# Patient Record
Sex: Male | Born: 1995 | Race: White | Hispanic: No | Marital: Single | Smoking: Current every day smoker
Health system: Southern US, Community
[De-identification: ages and names within clinical notes are randomized; demographics above are authoritative.]

## PROBLEM LIST (undated history)

## (undated) HISTORY — PX: TONSILLECTOMY: SUR1361

---

## 2020-08-06 ENCOUNTER — Other Ambulatory Visit: Payer: Self-pay

## 2020-08-06 ENCOUNTER — Encounter (HOSPITAL_COMMUNITY): Payer: Self-pay

## 2020-08-06 DIAGNOSIS — R079 Chest pain, unspecified: Secondary | ICD-10-CM | POA: Diagnosis present

## 2020-08-06 DIAGNOSIS — N63 Unspecified lump in unspecified breast: Secondary | ICD-10-CM | POA: Insufficient documentation

## 2020-08-06 NOTE — ED Triage Notes (Signed)
Patient arrived stating tonight around 8pm he began having left sided chest pain when taking a deep breath. Declines any other symptoms at this time.

## 2020-08-07 ENCOUNTER — Emergency Department (HOSPITAL_COMMUNITY)
Admission: EM | Admit: 2020-08-07 | Discharge: 2020-08-07 | Disposition: A | Discharge status: Home / Self Care | Payer: PRIVATE HEALTH INSURANCE | Attending: Emergency Medicine | Admitting: Emergency Medicine

## 2020-08-07 ENCOUNTER — Emergency Department (HOSPITAL_COMMUNITY): Payer: PRIVATE HEALTH INSURANCE

## 2020-08-07 DIAGNOSIS — N63 Unspecified lump in unspecified breast: Secondary | ICD-10-CM

## 2020-08-07 LAB — CBC
HCT: 42.5 % (ref 39.0–52.0)
Hemoglobin: 14.9 g/dL (ref 13.0–17.0)
MCH: 31.7 pg (ref 26.0–34.0)
MCHC: 35.1 g/dL (ref 30.0–36.0)
MCV: 90.4 fL (ref 80.0–100.0)
Platelets: 271 10*3/uL (ref 150–400)
RBC: 4.7 MIL/uL (ref 4.22–5.81)
RDW: 11.9 % (ref 11.5–15.5)
WBC: 7.3 10*3/uL (ref 4.0–10.5)
nRBC: 0 % (ref 0.0–0.2)

## 2020-08-07 LAB — BASIC METABOLIC PANEL
Anion gap: 11 (ref 5–15)
BUN: 15 mg/dL (ref 6–20)
CO2: 26 mmol/L (ref 22–32)
Calcium: 9.5 mg/dL (ref 8.9–10.3)
Chloride: 101 mmol/L (ref 98–111)
Creatinine, Ser: 0.92 mg/dL (ref 0.61–1.24)
GFR calc Af Amer: >60 mL/min (ref >60)
GFR calc non Af Amer: >60 mL/min (ref >60)
Glucose, Bld: 94 mg/dL (ref 70–99)
Potassium: 3.7 mmol/L (ref 3.5–5.1)
Sodium: 138 mmol/L (ref 135–145)

## 2020-08-07 LAB — TROPONIN I (HIGH SENSITIVITY): Troponin I (High Sensitivity): 4 ng/L (ref <18)

## 2020-08-07 NOTE — ED Provider Notes (Signed)
[facility] EMERGENCY DEPARTMENT Provider Note  CSN: [id number] Arrival date & time: 08/06/20 2122    History Chief Complaint  Patient presents with  . Chest Pain    HPI  Steven Mckay is a 24 y.o. male with no significant PMH reports he noticed a lump in his L chest after he got his Covid vaccine in April of this year. He reports no change in that lump in the meantime, but he did noticed some mild-moderate aching pain in that area last night with deep breath that prompted him to come to the ED for evaluation. Denies SOB, cough or fever. Pain has improved while waiting for an ED room to be available.    History reviewed. No pertinent past medical history.  History reviewed. No pertinent surgical history.  No family history on file.  Social History   Tobacco Use  . Smoking status: Not on file  Substance Use Topics  . Alcohol use: Not on file  . Drug use: Not on file     Home Medications Prior to Admission medications   Not on File     Allergies    Penicillins   Review of Systems   Review of Systems A comprehensive review of systems was completed and negative except as noted in HPI.    Physical Exam BP 118/60 (BP Location: Right Arm)   Pulse 68   Temp 98.9 F (37.2 C) (Oral)   Resp 16   SpO2 100%   Physical Exam Vitals and nursing note reviewed.  Constitutional:      Appearance: Normal appearance.  HENT:     Head: Normocephalic and atraumatic.     Nose: Nose normal.     Mouth/Throat:     Mouth: Mucous membranes are moist.  Eyes:     Extraocular Movements: Extraocular movements intact.     Conjunctiva/sclera: Conjunctivae normal.  Cardiovascular:     Rate and Rhythm: Normal rate.  Pulmonary:     Effort: Pulmonary effort is normal.     Breath sounds: Normal breath sounds.  Chest:     Comments: Small 1cm mass under the L nipple, not tender, no nipple discharge Abdominal:     General: Abdomen is flat.     Palpations: Abdomen is soft.      Tenderness: There is no abdominal tenderness.  Musculoskeletal:        General: No swelling. Normal range of motion.     Cervical back: Neck supple.  Skin:    General: Skin is warm and dry.  Neurological:     General: No focal deficit present.     Mental Status: He is alert.  Psychiatric:        Mood and Affect: Mood normal.      ED Results / Procedures / Treatments   Labs (all labs ordered are listed, but only abnormal results are displayed) Labs Reviewed  BASIC METABOLIC PANEL  CBC  TROPONIN I (HIGH SENSITIVITY)    EKG EKG Interpretation  Date/Time:  Saturday August 06 2020 23:47:22 EDT Ventricular Rate:  77 PR Interval:    QRS Duration: 78 QT Interval:  377 QTC Calculation: 427 R Axis:   87 Text Interpretation: Sinus rhythm Normal ECG No old tracing to compare Confirmed by Dione Booze ([id number]) on 08/07/2020 12:29:56 AM   Radiology DG Chest 2 View  Result Date: 08/07/2020 CLINICAL DATA:  Chest pain. EXAM: CHEST - 2 VIEW COMPARISON:  None. FINDINGS: The heart size and mediastinal contours are within normal  limits. Both lungs are clear. The visualized skeletal structures are unremarkable. IMPRESSION: No active cardiopulmonary disease. Electronically Signed   By: Aram Candela M.D.   On: 08/07/2020 00:09    Procedures Procedures  Medications Ordered in the ED Medications - No data to display   MDM Rules/Calculators/A&P MDM Patient reassured that his EKG, CXR and labs including troponin are normal. He does not have any signs of cardiopulmonary process. He is mostly concerned about the lump in his L breast. Advised that the evaluation for this would need to be done as an outpatient. He does not have a PCP, so will be referred to [facility] health [facility] wellness clinic. Recommend RTED for any other concerns.  ED Course  I have reviewed the triage vital signs and the nursing notes.  Pertinent labs & imaging results that were available during my care of the  patient were reviewed by me and considered in my medical decision making (see chart for details).     Final Clinical Impression(s) / ED Diagnoses Final diagnoses:  Breast lump    Rx / DC Orders ED Discharge Orders    None       Pollyann Savoy, MD 08/07/20 [phone]

## 2021-03-06 ENCOUNTER — Other Ambulatory Visit: Payer: Self-pay

## 2021-03-06 ENCOUNTER — Ambulatory Visit: Payer: 59 | Attending: Internal Medicine | Admitting: Internal Medicine

## 2021-03-06 ENCOUNTER — Encounter: Payer: Self-pay | Admitting: Internal Medicine

## 2021-03-06 VITALS — BP 123/83 | HR 81 | Resp 16 | Ht 67.5 in | Wt 109.4 lb

## 2021-03-06 DIAGNOSIS — F17210 Nicotine dependence, cigarettes, uncomplicated: Secondary | ICD-10-CM

## 2021-03-06 DIAGNOSIS — N6320 Unspecified lump in the left breast, unspecified quadrant: Secondary | ICD-10-CM

## 2021-03-06 DIAGNOSIS — Z2821 Immunization not carried out because of patient refusal: Secondary | ICD-10-CM

## 2021-03-06 NOTE — Progress Notes (Signed)
Patient ID: Steven Mckay, male    DOB: 07-Oct-1996  MRN: [id number]  CC: New Patient (Initial Visit) and Breast Mass (Left )   Subjective: Steven Mckay is a 25 y.o. male who presents for new pt visit. His concerns today include:   No previous PCP No chronic med issues.  1 yr ago notice swelling under LT nipple Got a little bigger in the beginning and would cause pain if he lays on his chest.  No increase in breast size.  She is adopted so he does not know whether there is any history of breast cancer in the family. No drainage from the nipple,  Past medical, social, family history and surgical history reviewed. Patient Active Problem List   Diagnosis Date Noted  . Tetanus, diphtheria, and acellular pertussis (Tdap) vaccination declined 03/06/2021     No current outpatient medications on file prior to visit.   No current facility-administered medications on file prior to visit.    Allergies  Allergen Reactions  . Penicillins Hives    Social History   Socioeconomic History  . Marital status: Single    Spouse name: Not on file  . Number of children: 0  . Years of education: Not on file  . Highest education level: Bachelor's degree (e.g., BA, AB, BS)  Occupational History  . Not on file  Tobacco Use  . Smoking status: Current Every Day Smoker  . Smokeless tobacco: Never Used  . Tobacco comment: 1-2 cigarettes/day  Vaping Use  . Vaping Use: Never used  Substance and Sexual Activity  . Alcohol use: Yes  . Drug use: Never  . Sexual activity: Not on file  Other Topics Concern  . Not on file  Social History Narrative  . Not on file   Social Determinants of Health   Financial Resource Strain: Not on file  Food Insecurity: Not on file  Transportation Needs: Not on file  Physical Activity: Not on file  Stress: Not on file  Social Connections: Not on file  Intimate Partner Violence: Not on file    Family History  Adopted: Yes  Family history unknown:  Yes    Past Surgical History:  Procedure Laterality Date  . TONSILLECTOMY      ROS: Review of Systems Negative except as stated above  PHYSICAL EXAM: BP 123/83   Pulse 81   Resp 16   Ht 5' 7.5" (1.715 m)   Wt 109 lb 6.4 oz (49.6 kg)   SpO2 97%   BMI 16.88 kg/m   Physical Exam  General appearance - alert, well appearing, young Caucasian male and in no distress Mental status - normal mood, behavior, speech, dress, motor activity, and thought processes Breasts -no gynecomastia.  No discharge from the nipple.  He has a mild feeling of fullness behind the left areola that is only appreciated with pinching the areola.  No abnormalities appreciated on examination of the right breast.  No axillary lymphadenopathy.  CMP Latest Ref Rng & Units 08/06/2020  Glucose 70 - 99 mg/dL 94  BUN 6 - 20 mg/dL 15  Creatinine 6.16 - 0.73 mg/dL 7.10  Sodium 626 - 948 mmol/L 138  Potassium 3.5 - 5.1 mmol/L 3.7  Chloride 98 - 111 mmol/L 101  CO2 22 - 32 mmol/L 26  Calcium 8.9 - 10.3 mg/dL 9.5   Lipid Panel  No results found for: CHOL, TRIG, HDL, CHOLHDL, VLDL, LDLCALC, LDLDIRECT  CBC    Component Value Date/Time   WBC 7.3  08/06/2020 2354   RBC 4.70 08/06/2020 2354   HGB 14.9 08/06/2020 2354   HCT 42.5 08/06/2020 2354   PLT 271 08/06/2020 2354   MCV 90.4 08/06/2020 2354   MCH 31.7 08/06/2020 2354   MCHC 35.1 08/06/2020 2354   RDW 11.9 08/06/2020 2354    ASSESSMENT AND PLAN: 1. Breast mass, left We will get an ultrasound to evaluate this area further.  Further management will be based on results.  Advised patient that he should be called by the breast center to schedule an appointment.  If he does not receive a call from them in 1 to 2 weeks he should call us back so that we can facilitate for him.  I will call him once I get the results of the report. - US BREAST COMPLETE UNI LEFT INC AXILLA; Future  2. Light smoker Advised to quit smoking.  Discussed health risks associated with  smoking.  Patient states he will consider doing so.  3. Tetanus, diphtheria, and acellular pertussis (Tdap) vaccination declined Offered.  Patient declined.     Patient was given the opportunity to ask questions.  Patient verbalized understanding of the plan and was able to repeat key elements of the plan.   Orders Placed This Encounter  Procedures  . US BREAST COMPLETE UNI LEFT INC AXILLA     Requested Prescriptions    No prescriptions requested or ordered in this encounter    Return if symptoms worsen or fail to improve.  Jonah Blue, MD, FACP

## 2021-03-06 NOTE — Patient Instructions (Addendum)
I have referred you to the breast center for an ultrasound on the left breast.  You will be called for this appointment.  If you have not heard from them in 1 to 2 weeks, please call us back and let us know so that we can facilitate getting the appointment.  I will call you once we get the results.

## 2021-04-10 ENCOUNTER — Ambulatory Visit
Admission: RE | Admit: 2021-04-10 | Discharge: 2021-04-10 | Disposition: A | Discharge status: Home / Self Care | Payer: PRIVATE HEALTH INSURANCE | Source: Ambulatory Visit | Attending: Internal Medicine | Admitting: Internal Medicine

## 2021-04-10 ENCOUNTER — Other Ambulatory Visit: Payer: Self-pay

## 2021-04-10 ENCOUNTER — Encounter: Payer: Self-pay | Admitting: Internal Medicine

## 2021-04-10 ENCOUNTER — Other Ambulatory Visit: Payer: Self-pay | Admitting: Internal Medicine

## 2021-04-10 DIAGNOSIS — N6320 Unspecified lump in the left breast, unspecified quadrant: Secondary | ICD-10-CM

## 2021-04-10 DIAGNOSIS — N62 Hypertrophy of breast: Secondary | ICD-10-CM

## 2021-07-02 ENCOUNTER — Encounter: Payer: Self-pay | Admitting: Internal Medicine

## 2021-07-14 ENCOUNTER — Encounter: Payer: Self-pay | Admitting: Internal Medicine

## 2021-07-14 ENCOUNTER — Other Ambulatory Visit: Payer: Self-pay

## 2021-07-14 ENCOUNTER — Ambulatory Visit (INDEPENDENT_AMBULATORY_CARE_PROVIDER_SITE_OTHER): Payer: 59 | Admitting: Internal Medicine

## 2021-07-14 VITALS — BP 114/70 | HR 72 | Ht 68.0 in | Wt 119.8 lb

## 2021-07-14 DIAGNOSIS — N62 Hypertrophy of breast: Secondary | ICD-10-CM

## 2021-07-14 LAB — LUTEINIZING HORMONE: LH: 3.67 m[IU]/mL (ref 1.50–9.30)

## 2021-07-14 NOTE — Patient Instructions (Signed)
-   Please stop by the lab today  

## 2021-07-14 NOTE — Progress Notes (Signed)
Name: Steven Mckay. Castille  MRN/ DOB: [id number], 30-Jan-1996    Age/ Sex: 25 y.o., male    PCP: Steven Matar, MD   Reason for Endocrinology Evaluation: Gynecomastia      Date of Initial Endocrinology Evaluation: 07/14/2021     HPI: Mr. Steven Mckay. Mckay is a 25 y.o. male with unremarkable  past medical history. The patient presented for initial endocrinology clinic visit on 07/14/2021 for consultative assistance with his left gynecomastia.   He noted a left gynecomastia 02/2020 , this was noted after receiving COVID vaccine. The mass increased this past month    Puberty age 46 yrs of age  Has normal hair distribution, shaves 1-2x a week  He is sexually active , denies erectile dysfunction  Denies nipple discharge   Tonsillectomy as a child  No chronic narcotic  Denies illict drug  No marijuana since college  No testosterone supplements   He has sharp pain in the lower abdomen for the past yr , this has been worsening in nature , radiates to the back, denies nausea o, diarrhea or constipation   No FH of breast or ovarian cancer      HISTORY:  Past Medical History: No past medical history on file. Past Surgical History:  Past Surgical History:  Procedure Laterality Date   TONSILLECTOMY      Social History:  reports that he has been smoking. He has never used smokeless tobacco. He reports current alcohol use. He reports that he does not use drugs. Family History: He was adopted. Family history is unknown by patient.   HOME MEDICATIONS: Allergies as of 07/14/2021       Reactions   Penicillins Hives        Medication List    as of July 14, 2021  7:18 AM   You have not been prescribed any medications.       REVIEW OF SYSTEMS: A comprehensive ROS was conducted with the patient and is negative except as per HPI and below:  ROS     OBJECTIVE:  VS: BP 114/70   Pulse 72   Ht 5\' 8"  (1.727 m)   Wt 119 lb 12.8 oz (54.3 kg)   SpO2 99%   BMI 18.22 kg/m     Wt Readings from Last 3 Encounters:  03/06/21 109 lb 6.4 oz (49.6 kg)     EXAM: General: Pt appears well and is in NAD  Eyes: External eye exam normal without stare, lid lag or exophthalmos.  EOM intact.   Neck: General: Supple without adenopathy. Thyroid: Thyroid size normal.  No goiter or nodules appreciated. No thyroid bruit.  Chest : Tiny glandular tissue on the left retro-areolar  Lungs: Clear with good BS bilat with no rales, rhonchi, or wheezes  Heart: Auscultation: RRR.  Abdomen: Normoactive bowel sounds, soft, nontender, without masses or organomegaly palpable  Genital : Normal testicular exam at 25 mL bilaterally   Extremities:  BL LE: No pretibial edema normal ROM and strength.  Skin: Hair: Texture and amount normal with gender appropriate distribution Skin Inspection: No rashes Skin Palpation: Skin temperature, texture, and thickness normal to palpation  Mental Status: Judgment, insight: Intact Orientation: Oriented to time, place, and person Memory: Intact for recent and remote events Mood and affect: No depression, anxiety, or agitation     DATA REVIEWED:   Results for Steven, Mckay (MRN Steven Mckay) as of 07/17/2021 13:32  Ref. Range 07/14/2021 09:50  LH Latest Ref Range: 1.50 - 9.30  mIU/mL 3.67  Prolactin Latest Ref Range: 2.0 - 18.0 ng/mL 11.4  Estradiol Latest Ref Range: < OR = 39 pg/mL 38  hCG Quant Latest Ref Range: 0 - 3 mIU/mL <1  TSH Latest Ref Range: 0.40 - 4.50 mIU/L 2.01  T4,Free(Direct) Latest Ref Range: 0.8 - 1.8 ng/dL 1.2   Breast ultrasound 04/10/2021   FINDINGS: On physical exam, I palpate a soft fullness in the retroareolar region of the LEFT breast. I palpate no mass.   Targeted ultrasound is performed, showing normal appearing fibroglandular tissue in the retroareolar region of the LEFT breast. There is slightly more breast parenchyma in the retroareolar region of the LEFT breast than the RIGHT, which is scanned for comparison.    IMPRESSION: Benign gynecomastia.    ASSESSMENT/PLAN/RECOMMENDATIONS:   Left Gynecomastia :   - I discussed with the patient the fact that gynecomastia can occur in men as testosterone levels decrease with age or in younger men with low testosterone levels, causing a change in the serum testosterone:estrogen ratio. We also discussed other potential etiologies of gynecomastia including numerous prescription medications and recreational drugs (marijuana and anabolic steroids in particular). Approximately 20% of cases of gynecomastia are idiopathic.  - Most of his labs are normal , awaiting on testosterone     F/U in 4 months    Signed electronically by: Steven Herrlich, MD  [facility] Endocrinology  [facility] [address] [place] [place], [place] [address] Phone: [phone] FAX: [phone]   CC: Steven Matar, MD [address] [place] [address] Phone: [phone] Fax: [phone]   Return to Endocrinology clinic as below: Future Appointments  Date Time Provider Department Center  07/14/2021  9:10 AM Steven Mckay, Steven Dolores, MD [id number] None

## 2021-07-15 LAB — BETA HCG QUANT (REF LAB): hCG Quant: <1 m[IU]/mL (ref 0–3)

## 2021-07-16 ENCOUNTER — Encounter: Payer: Self-pay | Admitting: Internal Medicine

## 2021-07-17 LAB — PROLACTIN: Prolactin: 11.4 ng/mL (ref 2.0–18.0)

## 2021-07-17 LAB — TSH: TSH: 2.01 mIU/L (ref 0.40–4.50)

## 2021-07-17 LAB — ESTRADIOL: Estradiol: 38 pg/mL (ref <=39)

## 2021-07-17 LAB — T4, FREE: Free T4: 1.2 ng/dL (ref 0.8–1.8)

## 2021-07-17 LAB — TESTOSTERONE, TOTAL, LC/MS/MS: Testosterone, Total, LC-MS-MS: 743 ng/dL (ref 250–1100)

## 2021-07-18 MED ORDER — TAMOXIFEN CITRATE 20 MG PO TABS: 20.0000 mg | ORAL_TABLET | Freq: Every day | ORAL | 0 refills | Status: DC

## 2021-10-13 ENCOUNTER — Other Ambulatory Visit: Payer: Self-pay | Admitting: Internal Medicine

## 2021-11-15 ENCOUNTER — Encounter: Payer: Self-pay | Admitting: Internal Medicine

## 2021-11-15 ENCOUNTER — Ambulatory Visit (INDEPENDENT_AMBULATORY_CARE_PROVIDER_SITE_OTHER): Payer: 59 | Admitting: Internal Medicine

## 2021-11-15 ENCOUNTER — Other Ambulatory Visit: Payer: Self-pay

## 2021-11-15 VITALS — BP 110/72 | HR 72 | Ht 68.0 in | Wt 128.0 lb

## 2021-11-15 DIAGNOSIS — N62 Hypertrophy of breast: Secondary | ICD-10-CM | POA: Diagnosis not present

## 2021-11-15 LAB — CBC
HCT: 42.9 % (ref 39.0–52.0)
Hemoglobin: 14.4 g/dL (ref 13.0–17.0)
MCHC: 33.6 g/dL (ref 30.0–36.0)
MCV: 90.5 fl (ref 78.0–100.0)
Platelets: 353 10*3/uL (ref 150.0–400.0)
RBC: 4.74 Mil/uL (ref 4.22–5.81)
RDW: 12.7 % (ref 11.5–15.5)
WBC: 6.3 10*3/uL (ref 4.0–10.5)

## 2021-11-15 LAB — COMPREHENSIVE METABOLIC PANEL
ALT: 13 U/L (ref 0–53)
AST: 16 U/L (ref 0–37)
Albumin: 4.4 g/dL (ref 3.5–5.2)
Alkaline Phosphatase: 41 U/L (ref 39–117)
BUN: 14 mg/dL (ref 6–23)
CO2: 31 mEq/L (ref 19–32)
Calcium: 9.4 mg/dL (ref 8.4–10.5)
Chloride: 103 mEq/L (ref 96–112)
Creatinine, Ser: 0.88 mg/dL (ref 0.40–1.50)
GFR: 121.06 mL/min (ref >60.00)
Glucose, Bld: 88 mg/dL (ref 70–99)
Potassium: 4.1 mEq/L (ref 3.5–5.1)
Sodium: 141 mEq/L (ref 135–145)
Total Bilirubin: 0.5 mg/dL (ref 0.2–1.2)
Total Protein: 6.8 g/dL (ref 6.0–8.3)

## 2021-11-15 LAB — LIPID PANEL
Cholesterol: 141 mg/dL (ref 0–200)
HDL: 48.9 mg/dL (ref >39.00)
LDL Cholesterol: 84 mg/dL (ref 0–99)
NonHDL: 92.29
Total CHOL/HDL Ratio: 3
Triglycerides: 42 mg/dL (ref 0.0–149.0)
VLDL: 8.4 mg/dL (ref 0.0–40.0)

## 2021-11-15 MED ORDER — TAMOXIFEN CITRATE 20 MG PO TABS
20.0000 mg | ORAL_TABLET | Freq: Every day | ORAL | 0 refills | Status: DC
Start: 2021-11-15 — End: 2023-05-01

## 2021-11-15 NOTE — Patient Instructions (Signed)
Continue Tamoxifen 20 mg daily

## 2021-11-15 NOTE — Progress Notes (Signed)
Name: Steven Mckay. Steven Mckay  MRN/ DOB: [id number], 07/23/1996    Age/ Sex: 25 y.o., male    PCP: Marcine Matar, MD   Reason for Endocrinology Evaluation: Gynecomastia      Date of Initial Endocrinology Evaluation: 07/14/2021    HPI: Steven Mckay is a 25 y.o. male with unremarkable  past medical history. The patient presented for initial endocrinology clinic visit on 07/14/2021 for consultative assistance with his left gynecomastia.    HISTORICAL SUMMARY: The patient was first noted a left gynecomastia 02/2020 , this was noted after receiving COVID vaccine. The mass increased over the following month.   Puberty age 37 yrs of age  Has normal hair distribution, shaves 1-2x a week  He is sexually active , denies erectile dysfunction  Denies nipple discharge   No chronic narcotic  Denies illict drug  No marijuana since college  No testosterone supplements   No FH of breast or ovarian cancer    His hormonal work up included normal Prolactin, TFT, LH and HCG   Tamoxifen 20 mg daily was started 06/2020    SUBJECTIVE:     Today (11/15/21):  Steven Mckay is here for a follow up on Gynecomastia   With the first month the nipple sensitivity has resolved, as well as decrease in size of Gynecomastia  He denies side effects to Tamoxifen such as rash, nausea or vomiting      HISTORY:  Past Medical History: No past medical history on file. Past Surgical History:  Past Surgical History:  Procedure Laterality Date   TONSILLECTOMY      Social History:  reports that he has been smoking. He has never used smokeless tobacco. He reports current alcohol use. He reports that he does not use drugs. Family History: He was adopted. Family history is unknown by patient.   HOME MEDICATIONS: Allergies as of 11/15/2021       Reactions   Penicillins Hives        Medication List        Accurate as of November 15, 2021  7:58 AM. If you have any questions, ask your nurse or  doctor.          tamoxifen 20 MG tablet Commonly known as: NOLVADEX TAKE 1 TABLET BY MOUTH EVERY DAY            OBJECTIVE:  VS: BP 110/72 (BP Location: Left Arm, Patient Position: Sitting, Cuff Size: Small)    Pulse 72    Ht 5\' 8"  (1.727 m)    Wt 128 lb (58.1 kg)    SpO2 99%    BMI 19.46 kg/m    Wt Readings from Last 3 Encounters:  11/15/21 128 lb (58.1 kg)  07/14/21 119 lb 12.8 oz (54.3 kg)  03/06/21 109 lb 6.4 oz (49.6 kg)     EXAM: General: Pt appears well and is in NAD  Neck: General: Supple without adenopathy. Thyroid: Thyroid size normal.  No goiter or nodules appreciated.  Chest : Tiny glandular tissue on the left retro-areolar  Lungs: Clear with good BS bilat with no rales, rhonchi, or wheezes  Heart: Auscultation: RRR.  Abdomen: Normoactive bowel sounds, soft, nontender, without masses or organomegaly palpable  Extremities:  BL LE: No pretibial edema normal ROM and strength.  Mental Status: Judgment, insight: Intact Orientation: Oriented to time, place, and person Mood and affect: No depression, anxiety, or agitation     DATA REVIEWED:  Latest Reference Range & Units 11/15/21 08:03  Sodium 135 - 145 mEq/L 141  Potassium 3.5 - 5.1 mEq/L 4.1  Chloride 96 - 112 mEq/L 103  CO2 19 - 32 mEq/L 31  Glucose 70 - 99 mg/dL 88  BUN 6 - 23 mg/dL 14  Creatinine 0.17 - 5.10 mg/dL 2.58  Calcium 8.4 - 52.7 mg/dL 9.4  Alkaline Phosphatase 39 - 117 U/L 41  Albumin 3.5 - 5.2 g/dL 4.4  AST 0 - 37 U/L 16  ALT 0 - 53 U/L 13  Total Protein 6.0 - 8.3 g/dL 6.8  Total Bilirubin 0.2 - 1.2 mg/dL 0.5  GFR >78.24 mL/min 121.06  Total CHOL/HDL Ratio  3  Cholesterol 0 - 200 mg/dL 235  HDL Cholesterol >36.14 mg/dL 43.15  LDL (calc) 0 - 99 mg/dL 84  NonHDL  40.08  Triglycerides 0.0 - 149.0 mg/dL 67.6  VLDL 0.0 - 19.5 mg/dL 8.4  WBC 4.0 - 09.3 K/uL 6.3  RBC 4.22 - 5.81 Mil/uL 4.74  Hemoglobin 13.0 - 17.0 g/dL 26.7  HCT 12.4 - 58.0 % 42.9  MCV 78.0 - 100.0 fl 90.5  MCHC  30.0 - 36.0 g/dL 99.8  RDW 33.8 - 25.0 % 12.7  Platelets 150.0 - 400.0 K/uL 353.0       Results for Steven Mckay, Steven Mckay (MRN [id number]) as of 07/17/2021 13:32  Ref. Range 07/14/2021 09:50  LH Latest Ref Range: 1.50 - 9.30 mIU/mL 3.67  Prolactin Latest Ref Range: 2.0 - 18.0 ng/mL 11.4  Estradiol Latest Ref Range: < OR = 39 pg/mL 38  hCG Quant Latest Ref Range: 0 - 3 mIU/mL <1  TSH Latest Ref Range: 0.40 - 4.50 mIU/L 2.01  T4,Free(Direct) Latest Ref Range: 0.8 - 1.8 ng/dL 1.2    Latest Reference Range & Units 07/14/21 09:50  Testosterone, Total, LC-MS-MS 250 - 1,100 ng/dL 937   Breast ultrasound 04/10/2021   FINDINGS: On physical exam, I palpate a soft fullness in the retroareolar region of the LEFT breast. I palpate no mass.   Targeted ultrasound is performed, showing normal appearing fibroglandular tissue in the retroareolar region of the LEFT breast. There is slightly more breast parenchyma in the retroareolar region of the LEFT breast than the RIGHT, which is scanned for comparison.   IMPRESSION: Benign gynecomastia.    ASSESSMENT/PLAN/RECOMMENDATIONS:   Left Gynecomastia :   - This has resolved with Tamoxifen use, we had discussed continuing this for another 4 months prior to discontinuation.  -CBC, BMP, and lipid panel have all come back normal   Medication Continue tamoxifen 20 mg daily     F/U in 4 months    Signed electronically by: Lyndle Herrlich, MD  [facility] Endocrinology  [facility] [address] [place] [place], [place] [address] Phone: [phone] FAX: [phone]   CC: Marcine Matar, MD [address] [place] [address] Phone: [phone] Fax: [phone]   Return to Endocrinology clinic as below: No future appointments.

## 2021-11-30 ENCOUNTER — Telehealth: Payer: PRIVATE HEALTH INSURANCE | Admitting: Physician Assistant

## 2021-11-30 DIAGNOSIS — B9689 Other specified bacterial agents as the cause of diseases classified elsewhere: Secondary | ICD-10-CM | POA: Diagnosis not present

## 2021-11-30 DIAGNOSIS — J208 Acute bronchitis due to other specified organisms: Secondary | ICD-10-CM

## 2021-11-30 MED ORDER — AZITHROMYCIN 250 MG PO TABS: ORAL_TABLET | ORAL | 0 refills | Status: AC

## 2021-11-30 MED ORDER — ALBUTEROL SULFATE HFA 108 (90 BASE) MCG/ACT IN AERS: 2.0000 | INHALATION_SPRAY | Freq: Four times a day (QID) | RESPIRATORY_TRACT | 0 refills | Status: DC | PRN

## 2021-11-30 MED ORDER — BENZONATATE 100 MG PO CAPS: 100.0000 mg | ORAL_CAPSULE | Freq: Three times a day (TID) | ORAL | 0 refills | Status: DC | PRN

## 2021-11-30 NOTE — Progress Notes (Signed)
We are sorry that you are not feeling well.  Here is how we plan to help!  Based on your presentation I believe you most likely have A cough due to bacteria.  When patients have a fever and a productive cough with a change in color or increased sputum production, we are concerned about bacterial bronchitis.  If left untreated it can progress to pneumonia.  If your symptoms do not improve with your treatment plan it is important that you contact your provider.   I have prescribed Azithromyin 250 mg: two tablets now and then one tablet daily for 4 additonal days    In addition you may use A prescription cough medication called Tessalon Perles 100mg . You may take 1-2 capsules every 8 hours as needed for your cough. I have also sent in an albuterol inhaler to use as directed, when needed, for wheezing.  From your responses in the eVisit questionnaire you describe inflammation in the upper respiratory tract which is causing a significant cough.  This is commonly called Bronchitis and has four common causes:   Allergies Viral Infections Acid Reflux Bacterial Infection Allergies, viruses and acid reflux are treated by controlling symptoms or eliminating the cause. An example might be a cough caused by taking certain blood pressure medications. You stop the cough by changing the medication. Another example might be a cough caused by acid reflux. Controlling the reflux helps control the cough.  USE OF BRONCHODILATOR ("RESCUE") INHALERS: There is a risk from using your bronchodilator too frequently.  The risk is that over-reliance on a medication which only relaxes the muscles surrounding the breathing tubes can reduce the effectiveness of medications prescribed to reduce swelling and congestion of the tubes themselves.  Although you feel brief relief from the bronchodilator inhaler, your asthma may actually be worsening with the tubes becoming more swollen and filled with mucus.  This can delay other crucial  treatments, such as oral steroid medications. If you need to use a bronchodilator inhaler daily, several times per day, you should discuss this with your provider.  There are probably better treatments that could be used to keep your asthma under control.     HOME CARE Only take medications as instructed by your medical team. Complete the entire course of an antibiotic. Drink plenty of fluids and get plenty of rest. Avoid close contacts especially the very young and the elderly Cover your mouth if you cough or cough into your sleeve. Always remember to wash your hands A steam or ultrasonic humidifier can help congestion.   GET HELP RIGHT AWAY IF: You develop worsening fever. You become short of breath You cough up blood. Your symptoms persist after you have completed your treatment plan MAKE SURE YOU  Understand these instructions. Will watch your condition. Will get help right away if you are not doing well or get worse.    Thank you for choosing an e-visit.  Your e-visit answers were reviewed by a board certified advanced clinical practitioner to complete your personal care plan. Depending upon the condition, your plan could have included both over the counter or prescription medications.  Please review your pharmacy choice. Make sure the pharmacy is open so you can pick up prescription now. If there is a problem, you may contact your provider through and have the prescription routed to another pharmacy.  Your safety is important to [employer]. If you have drug allergies check your prescription carefully.   For the next 24 hours you can  use MyChart to ask questions about today's visit, request a non-urgent call back, or ask for a work or school excuse. You will get an email in the next two days asking about your experience. I hope that your e-visit has been valuable and will speed your recovery.

## 2021-11-30 NOTE — Progress Notes (Signed)
I have spent 5 minutes in review of e-visit questionnaire, review and updating patient chart, medical decision making and response to patient.   Anasia Agro Cody Eligh Rybacki, PA-C    

## 2022-03-16 ENCOUNTER — Ambulatory Visit: Payer: 59 | Admitting: Internal Medicine

## 2022-07-26 IMAGING — CR DG CHEST 2V
2 series · 2 of 2 positions shown · non-contrast
Comparison: None.

CLINICAL DATA: Chest pain.

EXAM:
CHEST - 2 VIEW

[w chest pa]
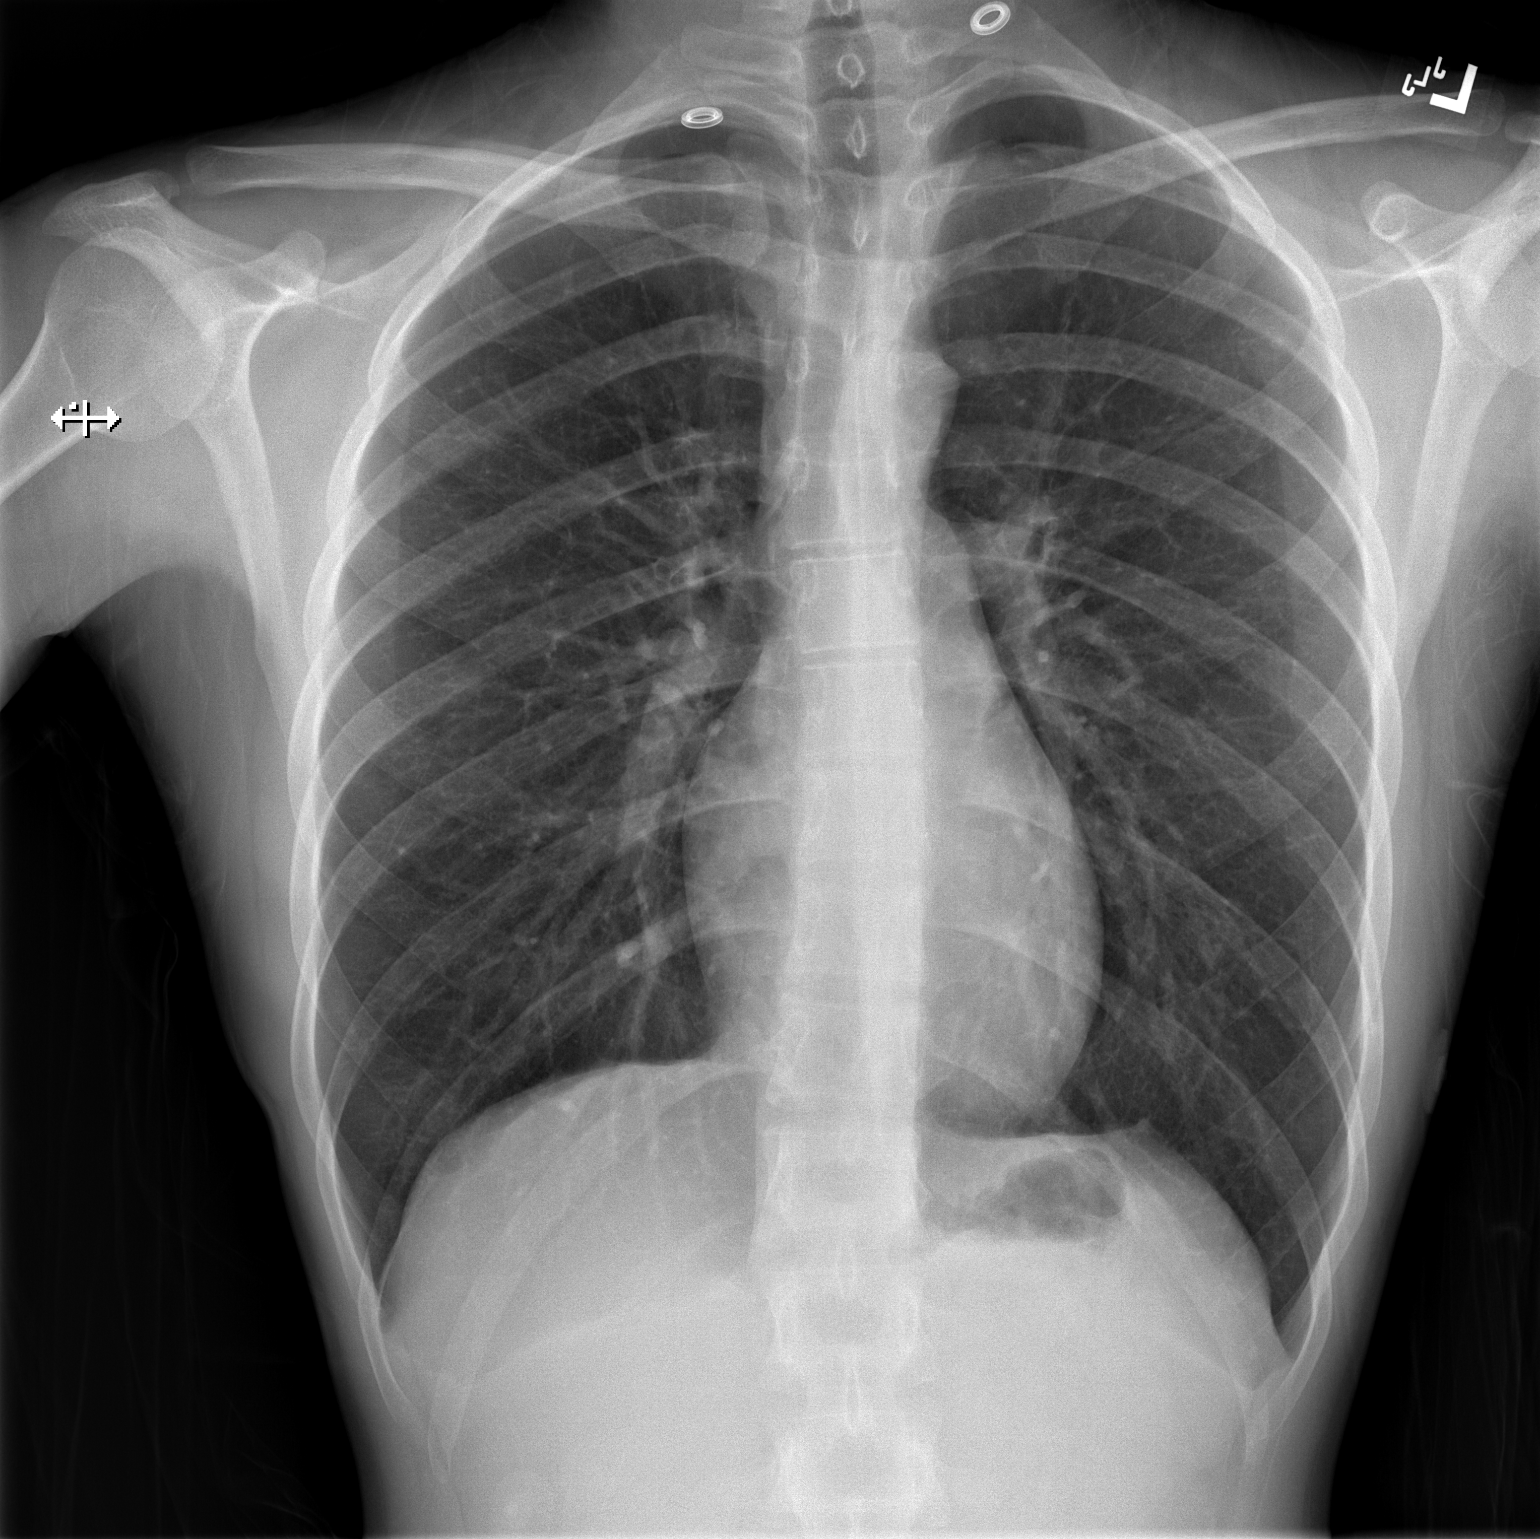

[w chest lat]
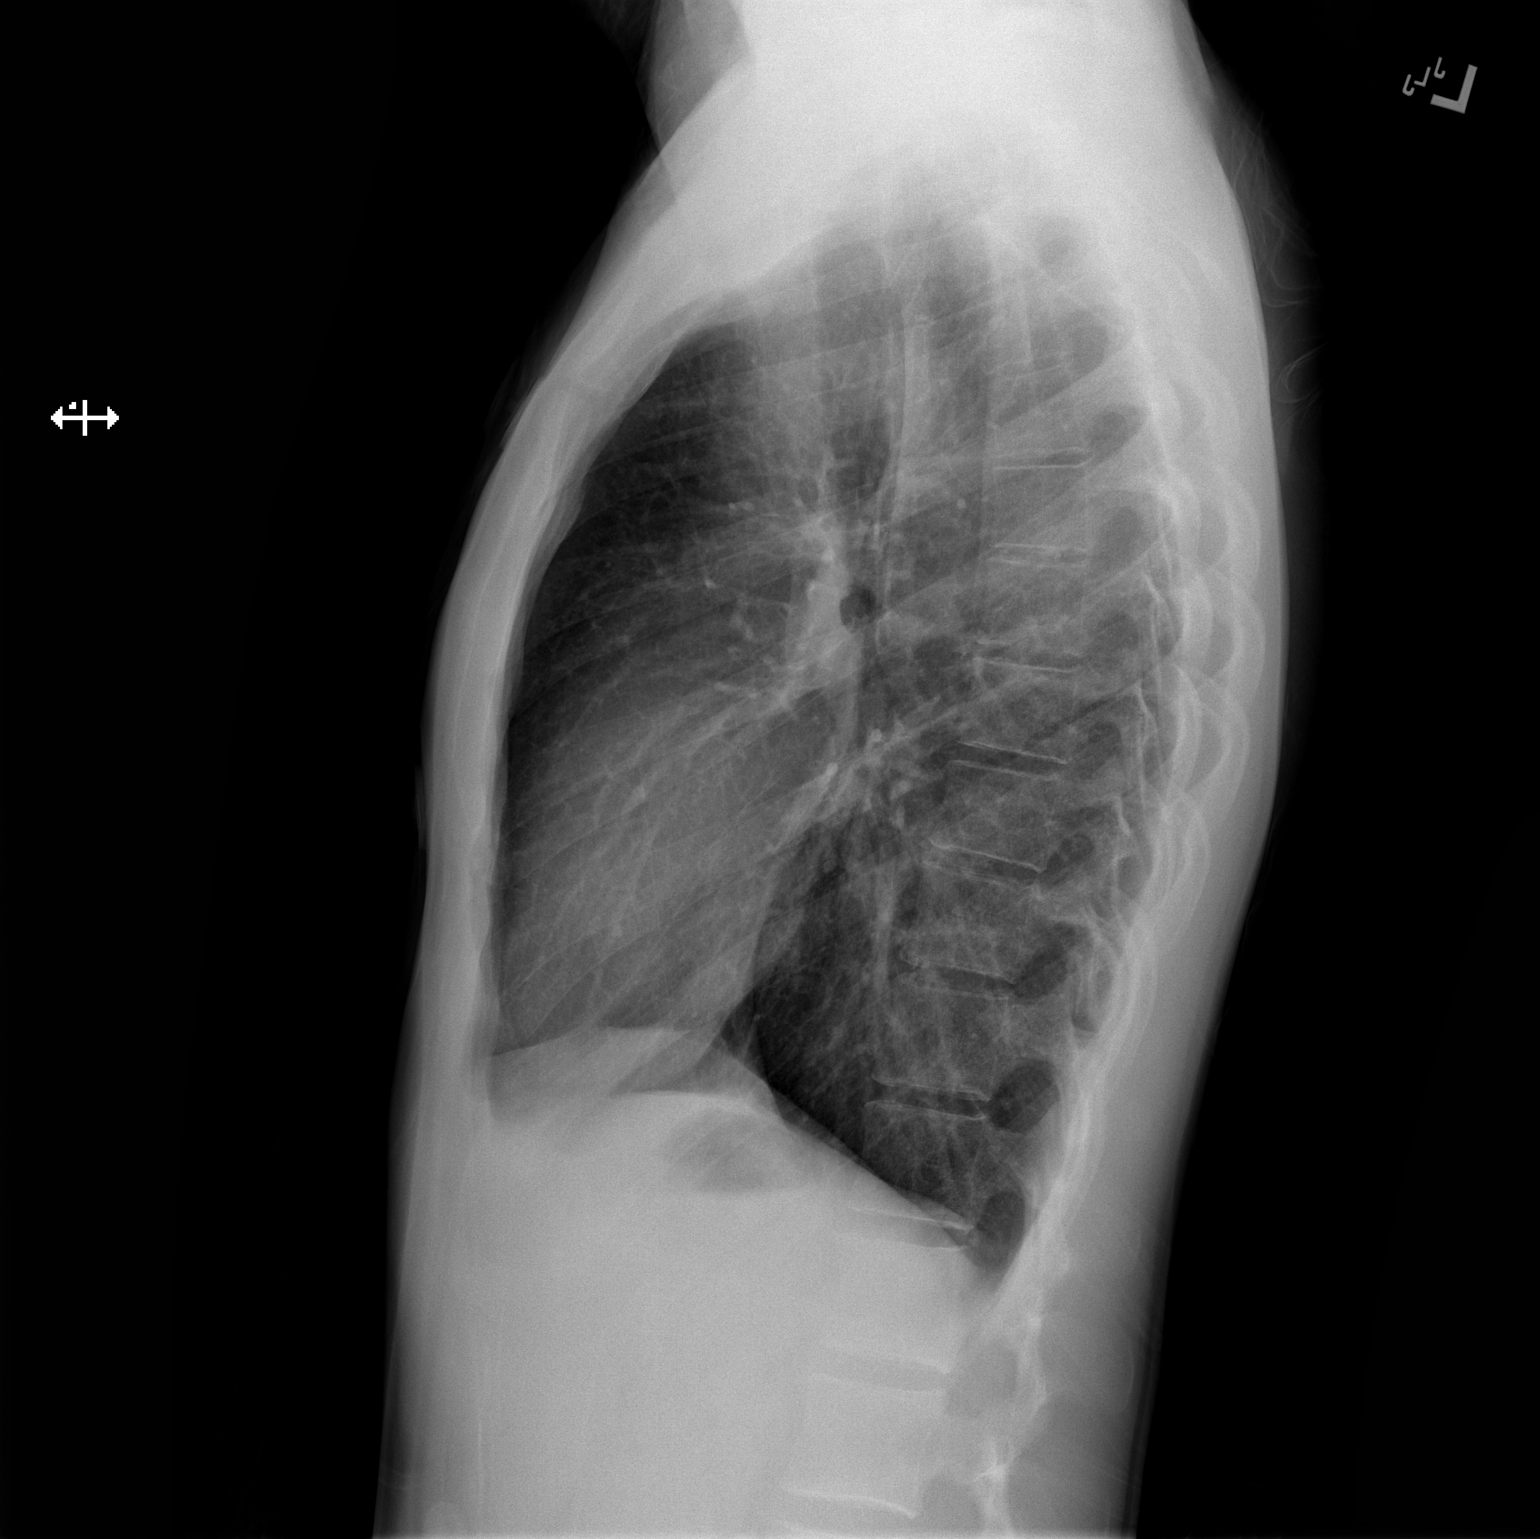

[2 of 2 positions shown; findings below may reference images not displayed]

FINDINGS: The heart size and mediastinal contours are within normal limits.
Both lungs are clear. The visualized skeletal structures are
unremarkable.
IMPRESSION: No active cardiopulmonary disease.

## 2023-03-29 IMAGING — US US BREAST*L* LIMITED INC AXILLA
1 series · 8 of 8 positions shown · non-contrast
Comparison: None.

CLINICAL DATA: Patient reports mass behind the LEFT nipple the LEFT
was noted 1 year ago. Mass initially got larger and has stabilized.

EXAM:
ULTRASOUND OF THE LEFT BREAST

[Series 1: us breast*left* limited inc axilla · 0.04mm/px · 8 of 8 slices shown]
[im 1/8]
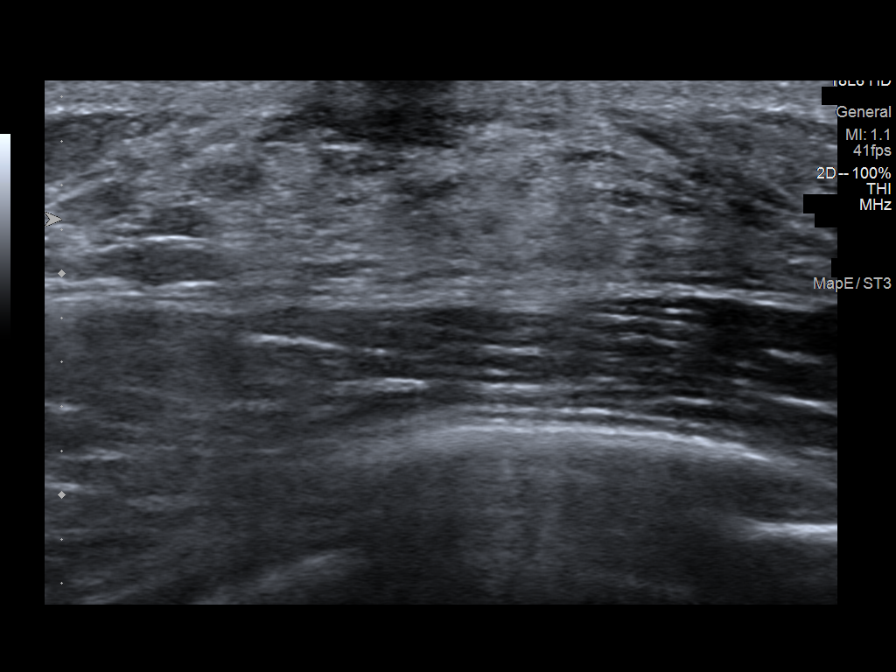
[im 2/8]
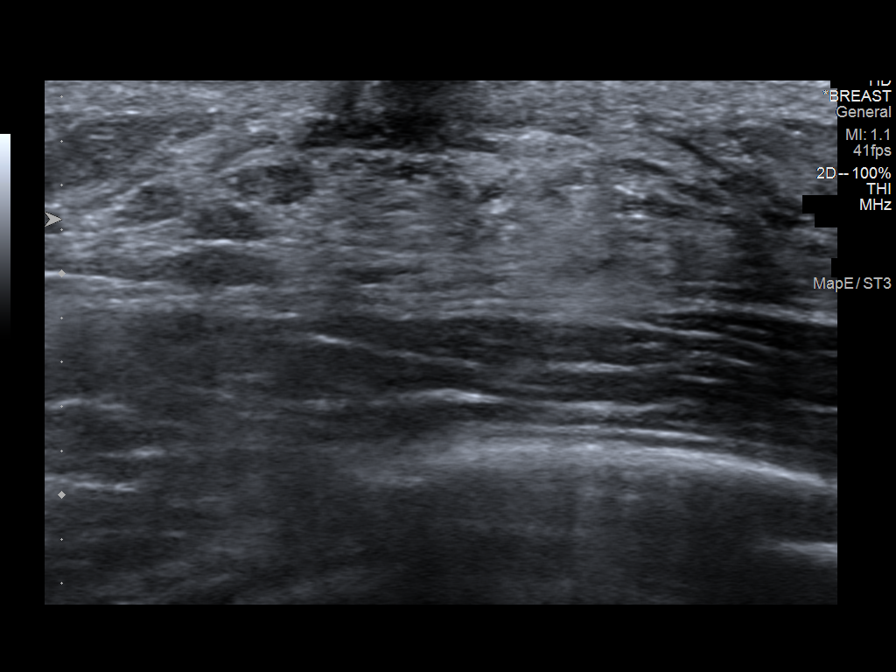
[im 3/8]
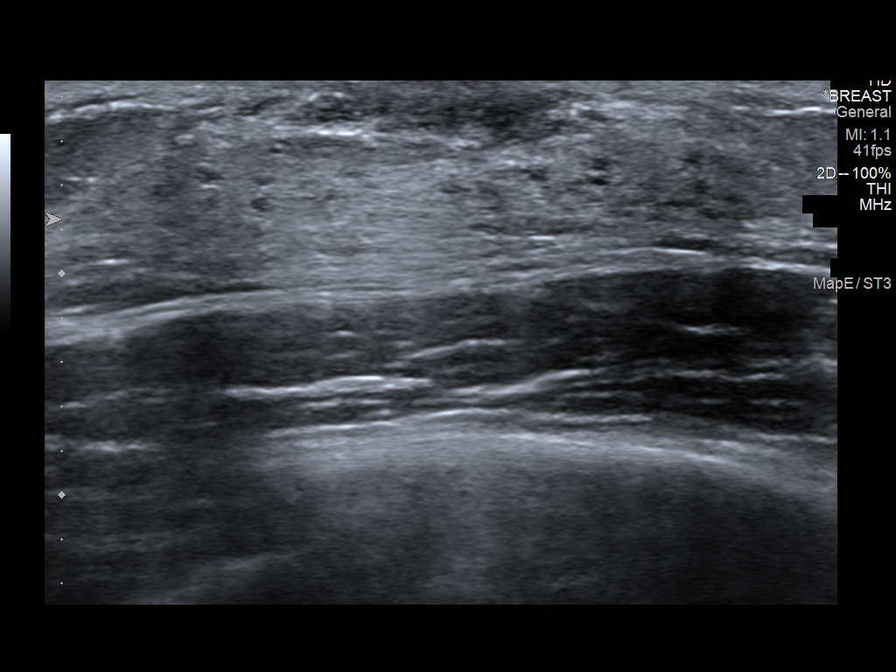
[im 4/8]
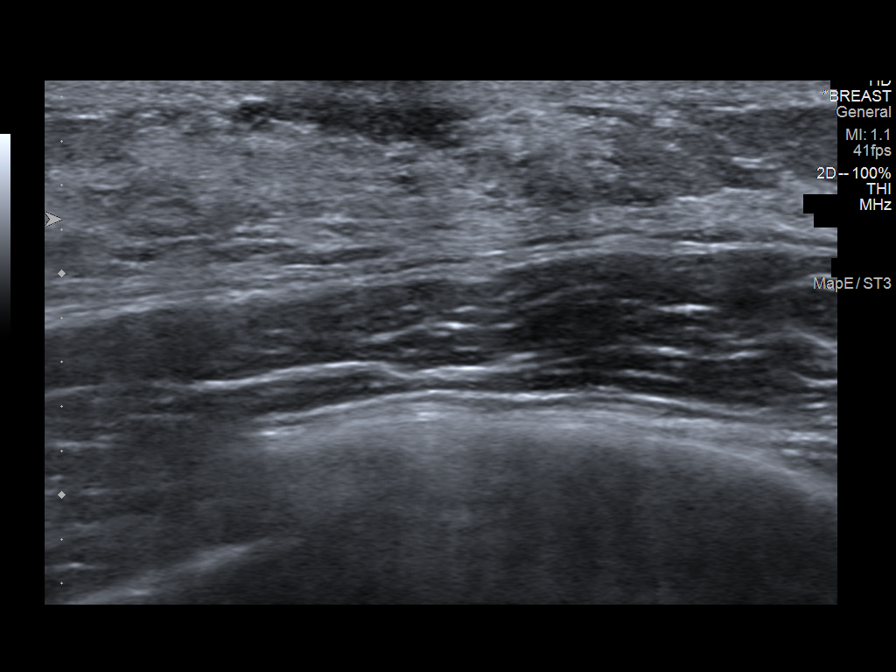
[im 5/8]
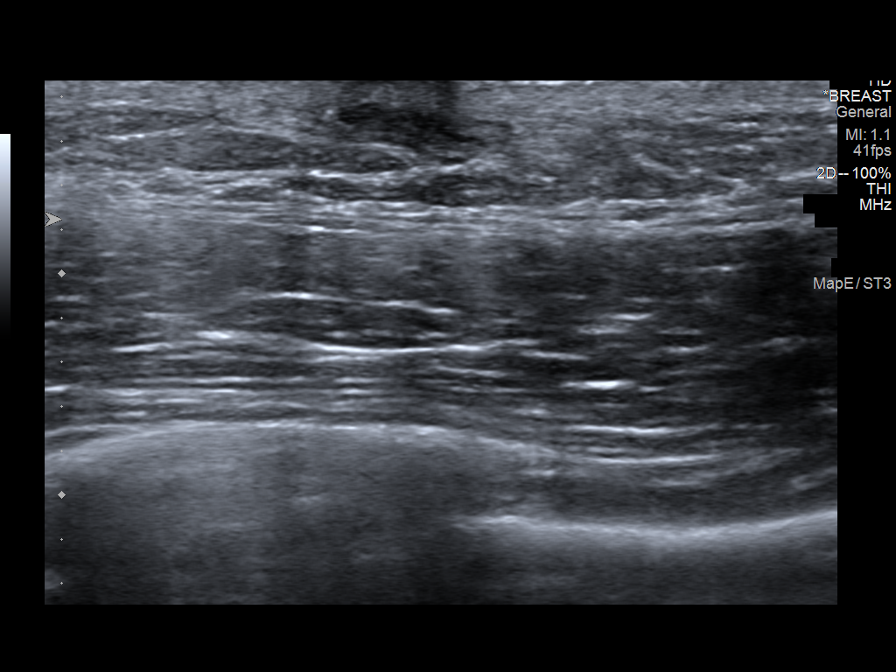
[im 6/8]
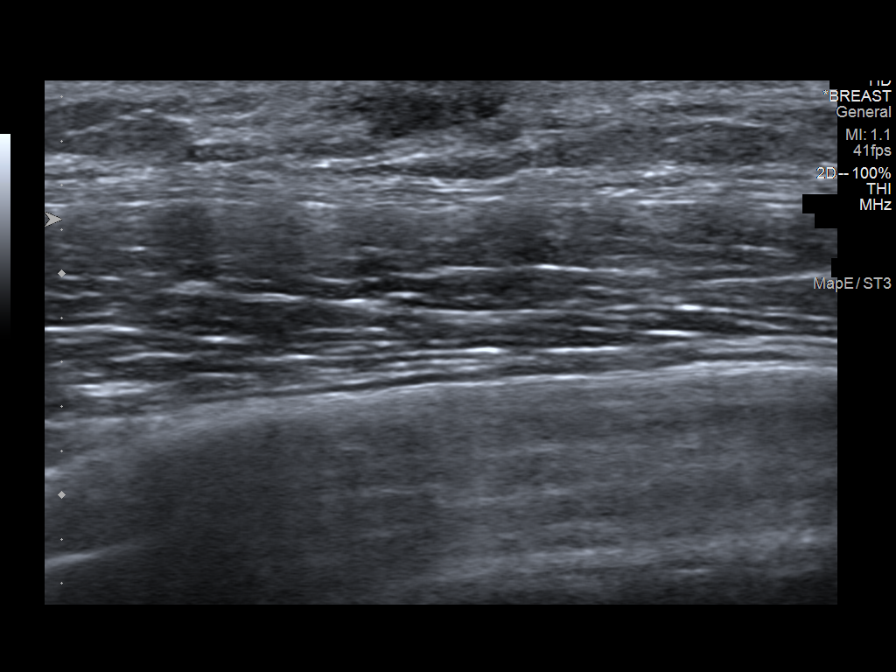
[im 7/8]
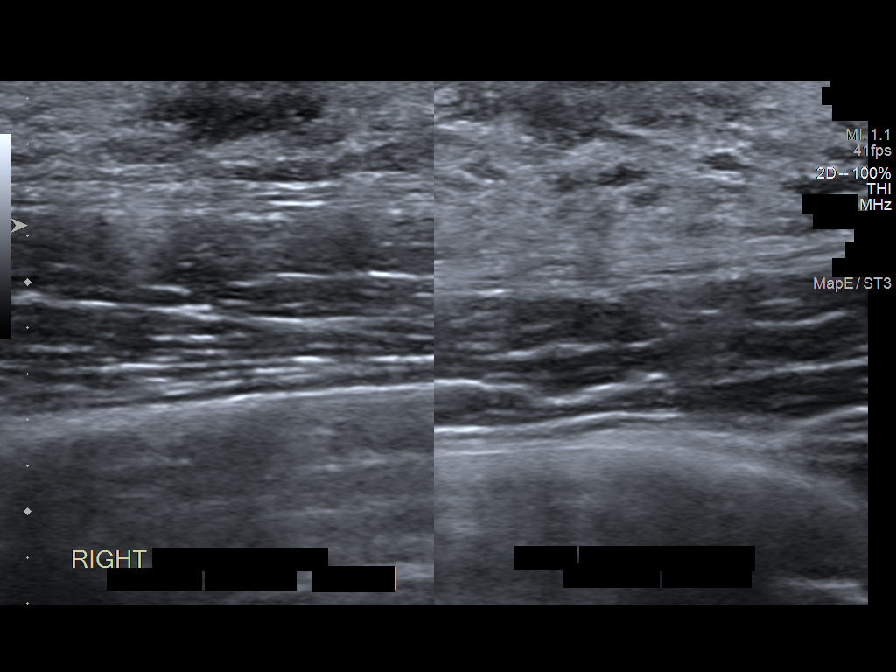
[im 8/8]
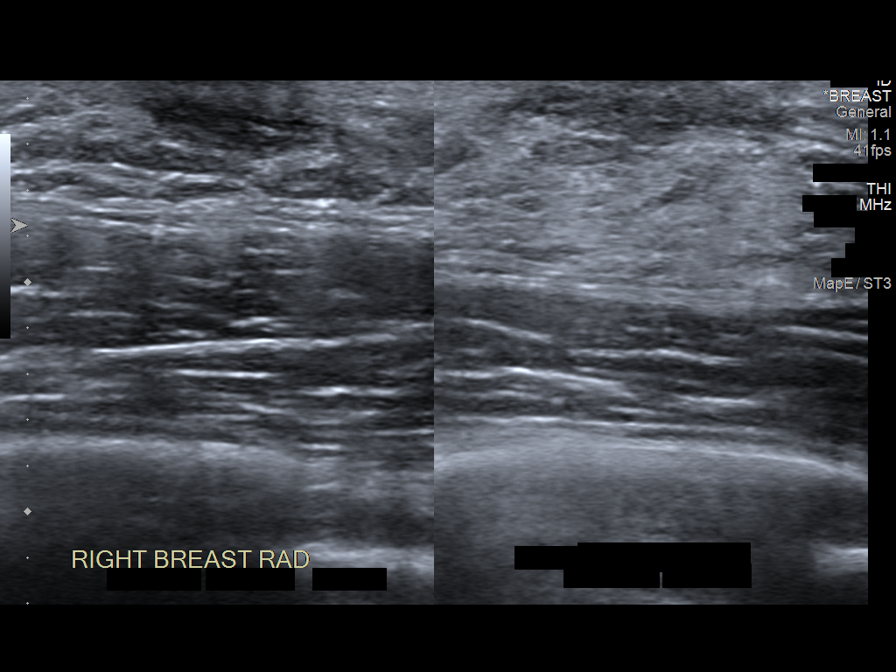

[8 of 8 positions shown; findings below may reference images not displayed]

FINDINGS: On physical exam, I palpate a soft fullness in the retroareolar
region of the LEFT breast. I palpate no mass.

Targeted ultrasound is performed, showing normal appearing
fibroglandular tissue in the retroareolar region of the LEFT breast.
There is slightly more breast parenchyma in the retroareolar region
of the LEFT breast than the RIGHT, which is scanned for comparison.
IMPRESSION: Benign gynecomastia.

Gynecomastia is common, and can occur with changes in the
testosterone:estrogen ratio. Potential causes include numerous
prescription medications and recreational drugs (marijuana and
anabolic steroids in particular). Approximately 2% of patients with
gynecomastia have testicular tumors. 20% of cases are idiopathic.
Gynecomastia does not increase the risk for breast cancer.

Recommend correlation with testicular exam and possible testicular
ultrasound. Consider evaluation of serum estradiol, alpha
fetoprotein, and human chorionic gonadotrophin levels. Surgical
consultation for possible excision can be considered if symptoms
become problematic.

RECOMMENDATION:
Clinical follow-up if needed.

I have discussed the findings and recommendations with the patient.
If applicable, a reminder letter will be sent to the patient
regarding the next appointment.

BI-RADS CATEGORY  2: Benign.

## 2023-05-01 ENCOUNTER — Encounter: Payer: Self-pay | Admitting: Internal Medicine

## 2023-05-01 ENCOUNTER — Ambulatory Visit: Payer: BC Managed Care – PPO | Admitting: Internal Medicine

## 2023-05-01 VITALS — BP 122/80 | HR 52 | Ht 68.0 in | Wt 128.0 lb

## 2023-05-01 DIAGNOSIS — N62 Hypertrophy of breast: Secondary | ICD-10-CM | POA: Diagnosis not present

## 2023-05-01 MED ORDER — TAMOXIFEN CITRATE 20 MG PO TABS: 20.0000 mg | ORAL_TABLET | Freq: Every day | ORAL | 2 refills | Status: AC

## 2023-05-01 NOTE — Progress Notes (Signed)
Name: Steven Mckay  MRN/ DOB: [id number], 1996/05/28    Age/ Sex: 27 y.o., male    PCP: Marcine Matar, MD   Reason for Endocrinology Evaluation: Gynecomastia      Date of Initial Endocrinology Evaluation: 07/14/2021    HPI: Steven Mckay is a 27 y.o. male with unremarkable  past medical history. The patient presented for initial endocrinology clinic visit on 07/14/2021 for consultative assistance with his left gynecomastia.    HISTORICAL SUMMARY: The patient was first noted a left gynecomastia 02/2020 , this was noted after receiving COVID vaccine. The mass increased over the following month.   Puberty age 44 yrs of age  Has normal hair distribution Denies erectile dysfunction  Denies nipple discharge   No chronic narcotic  Denies illict drug  No marijuana  No testosterone supplements   No FH of breast or ovarian cancer    His hormonal work up included normal Prolactin 11.4 NG/mL, TFT, LH 3.67 mIU/mL, HCG <1, estradiol 38 PG/mL and an normal testosterone level of 743 NG/DL 02/980  Tamoxifen 20 mg daily was started 06/2020    SUBJECTIVE:     Today (05/01/23):  Steven Mckay is here for a follow up on Gynecomastia .  He has not been here in 18 months.   He was able to stop Tamoxifen for ~ 1 yrs but noted left nipple sensitivity and breast enlargement ~ 1 months ago , restarted Tamoxifen ~ 2-3 weeks ago   Denies nausea or vomiting  Denies changes in hair growth  Denies erectile dysfunction   Once a month cannabis   No FH of ovarian or breast ca   HISTORY:  Past Medical History: No past medical history on file. Past Surgical History:  Past Surgical History:  Procedure Laterality Date   TONSILLECTOMY      Social History:  reports that he has been smoking. He has never used smokeless tobacco. He reports current alcohol use. He reports that he does not use drugs. Family History: He was adopted. Family history is unknown by patient.   HOME  MEDICATIONS: Allergies as of 05/01/2023       Reactions   Penicillins Hives        Medication List        Accurate as of May 01, 2023  2:15 PM. If you have any questions, ask your nurse or doctor.          STOP taking these medications    albuterol 108 (90 Base) MCG/ACT inhaler Commonly known as: VENTOLIN HFA Stopped by: Scarlette Shorts, MD   benzonatate 100 MG capsule Commonly known as: TESSALON Stopped by: Scarlette Shorts, MD       TAKE these medications    tamoxifen 20 MG tablet Commonly known as: NOLVADEX Take 1 tablet (20 mg total) by mouth daily.            OBJECTIVE:  VS: BP 122/80 (BP Location: Left Arm, Patient Position: Sitting, Cuff Size: Normal)   Pulse (!) 52   Ht 5\' 8"  (1.727 m)   Wt 128 lb (58.1 kg)   SpO2 97%   BMI 19.46 kg/m    Wt Readings from Last 3 Encounters:  05/01/23 128 lb (58.1 kg)  11/15/21 128 lb (58.1 kg)  07/14/21 119 lb 12.8 oz (54.3 kg)     EXAM: General: Pt appears well and is in NAD  Neck: General: Supple without adenopathy. Thyroid: Thyroid size normal.  No goiter or  nodules appreciated.  Chest : 1.5x 1.5 cm  left retro-areolar glandular tissue   Lungs: Clear with good BS bilat   Heart: Auscultation: RRR.  Abdomen: Soft, nontender  Extremities:  BL LE: No pretibial edema  Mental Status: Judgment, insight: Intact Orientation: Oriented to time, place, and person Mood and affect: No depression, anxiety, or agitation     DATA REVIEWED:     Latest Reference Range & Units 05/01/23 14:31  Sodium 135 - 145 mEq/L 138  Potassium 3.5 - 5.1 mEq/L 3.6  Chloride 96 - 112 mEq/L 100  CO2 19 - 32 mEq/L 27  Glucose 70 - 99 mg/dL 85  BUN 6 - 23 mg/dL 14  Creatinine 1.61 - 0.96 mg/dL 0.45  Calcium 8.4 - 40.9 mg/dL 9.3  Alkaline Phosphatase 39 - 117 U/L 44  Albumin 3.5 - 5.2 g/dL 4.7  AST 0 - 37 U/L 20  ALT 0 - 53 U/L 16  Total Protein 6.0 - 8.3 g/dL 7.3  Total Bilirubin 0.2 - 1.2 mg/dL 0.8  GFR  >81.19 mL/min 119.42    Latest Reference Range & Units 05/01/23 14:31  LH 1.50 - 9.30 mIU/mL 6.26  FSH 1.4 - 18.1 mIU/ML 4.9  Prolactin 2.0 - 18.0 ng/mL 8.6  Glucose 70 - 99 mg/dL 85  Estradiol < OR = 39 pg/mL 38  TSH 0.35 - 5.50 uIU/mL 2.48  T4,Free(Direct) 0.60 - 1.60 ng/dL 1.47     Results for Steven Mckay, Steven Mckay (MRN [id number]) as of 07/17/2021 13:32  Ref. Range 07/14/2021 09:50  LH Latest Ref Range: 1.50 - 9.30 mIU/mL 3.67  Prolactin Latest Ref Range: 2.0 - 18.0 ng/mL 11.4  Estradiol Latest Ref Range: < OR = 39 pg/mL 38  hCG Quant Latest Ref Range: 0 - 3 mIU/mL <1  TSH Latest Ref Range: 0.40 - 4.50 mIU/L 2.01  T4,Free(Direct) Latest Ref Range: 0.8 - 1.8 ng/dL 1.2    Latest Reference Range & Units 07/14/21 09:50  Testosterone, Total, LC-MS-MS 250 - 1,100 ng/dL 865   Breast ultrasound 04/10/2021   FINDINGS: On physical exam, I palpate a soft fullness in the retroareolar region of the LEFT breast. I palpate no mass.   Targeted ultrasound is performed, showing normal appearing fibroglandular tissue in the retroareolar region of the LEFT breast. There is slightly more breast parenchyma in the retroareolar region of the LEFT breast than the RIGHT, which is scanned for comparison.   IMPRESSION: Benign gynecomastia.    ASSESSMENT/PLAN/RECOMMENDATIONS:   Left Gynecomastia :  -This has been idiopathic in nature -We discussed high risk of gynecomastia in conjunction with cannabis use -He was able to discontinue tamoxifen for approximately a year, but has noted recurrence of gynecomastia and sensitivity of the nipple on the left -Labs today continue to show normal prolactin, FSH, LH, estradiol, and TFTs -Testosterone is pending -Refill of tamoxifen has been sent   Medication Continue tamoxifen 20 mg daily     F/U in 6 months    Signed electronically by: Lyndle Herrlich, MD  [facility]  [facility] [address] [place] [place], [place] [address] Phone: [phone] FAX: [phone]   CC: Marcine Matar, MD [address] [place] [address] [place] [address] Phone: [phone] Fax: [phone]   Return to Endocrinology clinic as below: No future appointments.

## 2023-05-02 LAB — COMPREHENSIVE METABOLIC PANEL
ALT: 16 U/L (ref 0–53)
AST: 20 U/L (ref 0–37)
Albumin: 4.7 g/dL (ref 3.5–5.2)
Alkaline Phosphatase: 44 U/L (ref 39–117)
BUN: 14 mg/dL (ref 6–23)
CO2: 27 mEq/L (ref 19–32)
Calcium: 9.3 mg/dL (ref 8.4–10.5)
Chloride: 100 mEq/L (ref 96–112)
Creatinine, Ser: 0.89 mg/dL (ref 0.40–1.50)
GFR: 119.42 mL/min (ref >60.00)
Glucose, Bld: 85 mg/dL (ref 70–99)
Potassium: 3.6 mEq/L (ref 3.5–5.1)
Sodium: 138 mEq/L (ref 135–145)
Total Bilirubin: 0.8 mg/dL (ref 0.2–1.2)
Total Protein: 7.3 g/dL (ref 6.0–8.3)

## 2023-05-02 LAB — TSH: TSH: 2.48 u[IU]/mL (ref 0.35–5.50)

## 2023-05-02 LAB — LUTEINIZING HORMONE: LH: 6.26 m[IU]/mL (ref 1.50–9.30)

## 2023-05-02 LAB — FOLLICLE STIMULATING HORMONE: FSH: 4.9 m[IU]/mL (ref 1.4–18.1)

## 2023-05-02 LAB — T4, FREE: Free T4: 1.13 ng/dL (ref 0.60–1.60)

## 2023-05-04 LAB — PROLACTIN: Prolactin: 8.6 ng/mL (ref 2.0–18.0)

## 2023-05-04 LAB — TESTOSTERONE, TOTAL, LC/MS/MS: Testosterone, Total, LC-MS-MS: 1043 ng/dL (ref 250–1100)

## 2023-05-04 LAB — ESTRADIOL: Estradiol: 38 pg/mL (ref <=39)

## 2023-09-09 ENCOUNTER — Telehealth: Payer: BC Managed Care – PPO | Admitting: Physician Assistant

## 2023-09-09 DIAGNOSIS — B9689 Other specified bacterial agents as the cause of diseases classified elsewhere: Secondary | ICD-10-CM

## 2023-09-09 DIAGNOSIS — J019 Acute sinusitis, unspecified: Secondary | ICD-10-CM

## 2023-09-09 MED ORDER — DOXYCYCLINE HYCLATE 100 MG PO TABS
100.0000 mg | ORAL_TABLET | Freq: Two times a day (BID) | ORAL | 0 refills | Status: AC
Start: 2023-09-09 — End: ?

## 2023-09-09 NOTE — Progress Notes (Signed)
Had completed EV already. Just needing a work note.

## 2023-09-09 NOTE — Progress Notes (Signed)

## 2023-11-05 ENCOUNTER — Ambulatory Visit: Payer: BC Managed Care – PPO | Admitting: Internal Medicine

## 2023-11-05 NOTE — Progress Notes (Unsigned)
Name: Steven Mckay. Balthazor  MRN/ DOB: [id number], June 30, 1996    Age/ Sex: 27 y.o., male    PCP: Marcine Matar, MD   Reason for Endocrinology Evaluation: Gynecomastia      Date of Initial Endocrinology Evaluation: 07/14/2021    HPI: Mr. Steven Visconti. Mckay is a 27 y.o. male with unremarkable  past medical history. The patient presented for initial endocrinology clinic visit on 07/14/2021 for consultative assistance with his left gynecomastia.    HISTORICAL SUMMARY: The patient was first noted a left gynecomastia 02/2020 , this was noted after receiving COVID vaccine. The mass increased over the following month.   Puberty age 66 yrs of age  Has normal hair distribution Denies erectile dysfunction  Denies nipple discharge   No chronic narcotic  Denies illict drug  No marijuana  No testosterone supplements   No FH of breast or ovarian cancer    His hormonal work up included normal Prolactin 11.4 NG/mL, TFT, LH 3.67 mIU/mL, HCG <1, estradiol 38 PG/mL and an normal testosterone level of 743 NG/DL 0/1751  Tamoxifen 20 mg daily was started 06/2020    No family history of ovarian or breast cancer  SUBJECTIVE:     Today (11/05/23):  Steven Mckay is here for a follow up on Gynecomastia .      Denies nausea or vomiting  Denies changes in hair growth  Denies erectile dysfunction   Once a month cannabis   No FH of ovarian or breast ca   HISTORY:  Past Medical History: No past medical history on file. Past Surgical History:  Past Surgical History:  Procedure Laterality Date   TONSILLECTOMY      Social History:  reports that he has been smoking. He has never used smokeless tobacco. He reports current alcohol use. He reports that he does not use drugs. Family History: He was adopted. Family history is unknown by patient.   HOME MEDICATIONS: Allergies as of 11/05/2023       Reactions   Penicillins Hives        Medication List        Accurate as of November 05, 2023   6:43 AM. If you have any questions, ask your nurse or doctor.          doxycycline 100 MG tablet Commonly known as: VIBRA-TABS Take 1 tablet (100 mg total) by mouth 2 (two) times daily.   tamoxifen 20 MG tablet Commonly known as: NOLVADEX Take 1 tablet (20 mg total) by mouth daily.            OBJECTIVE:  VS: There were no vitals taken for this visit.   Wt Readings from Last 3 Encounters:  05/01/23 128 lb (58.1 kg)  11/15/21 128 lb (58.1 kg)  07/14/21 119 lb 12.8 oz (54.3 kg)     EXAM: General: Pt appears well and is in NAD  Neck: General: Supple without adenopathy. Thyroid: Thyroid size normal.  No goiter or nodules appreciated.  Chest : 1.5x 1.5 cm  left retro-areolar glandular tissue   Lungs: Clear with good BS bilat   Heart: Auscultation: RRR.  Abdomen: Soft, nontender  Extremities:  BL LE: No pretibial edema  Mental Status: Judgment, insight: Intact Orientation: Oriented to time, place, and person Mood and affect: No depression, anxiety, or agitation     DATA REVIEWED:     Latest Reference Range & Units 05/01/23 14:31  Sodium 135 - 145 mEq/L 138  Potassium 3.5 - 5.1 mEq/L 3.6  Chloride 96 - 112 mEq/L 100  CO2 19 - 32 mEq/L 27  Glucose 70 - 99 mg/dL 85  BUN 6 - 23 mg/dL 14  Creatinine 4.09 - 8.11 mg/dL 9.14  Calcium 8.4 - 78.2 mg/dL 9.3  Alkaline Phosphatase 39 - 117 U/L 44  Albumin 3.5 - 5.2 g/dL 4.7  AST 0 - 37 U/L 20  ALT 0 - 53 U/L 16  Total Protein 6.0 - 8.3 g/dL 7.3  Total Bilirubin 0.2 - 1.2 mg/dL 0.8  GFR >95.62 mL/min 119.42    Latest Reference Range & Units 05/01/23 14:31  LH 1.50 - 9.30 mIU/mL 6.26  FSH 1.4 - 18.1 mIU/ML 4.9  Prolactin 2.0 - 18.0 ng/mL 8.6  Glucose 70 - 99 mg/dL 85  Estradiol < OR = 39 pg/mL 38  TSH 0.35 - 5.50 uIU/mL 2.48  T4,Free(Direct) 0.60 - 1.60 ng/dL 1.30     Results for Steven Mckay, Steven Mckay (MRN [id number]) as of 07/17/2021 13:32  Ref. Range 07/14/2021 09:50  LH Latest Ref Range: 1.50 - 9.30 mIU/mL 3.67   Prolactin Latest Ref Range: 2.0 - 18.0 ng/mL 11.4  Estradiol Latest Ref Range: < OR = 39 pg/mL 38  hCG Quant Latest Ref Range: 0 - 3 mIU/mL <1  TSH Latest Ref Range: 0.40 - 4.50 mIU/L 2.01  T4,Free(Direct) Latest Ref Range: 0.8 - 1.8 ng/dL 1.2    Latest Reference Range & Units 07/14/21 09:50  Testosterone, Total, LC-MS-MS 250 - 1,100 ng/dL 295   Breast ultrasound 04/10/2021   FINDINGS: On physical exam, I palpate a soft fullness in the retroareolar region of the LEFT breast. I palpate no mass.   Targeted ultrasound is performed, showing normal appearing fibroglandular tissue in the retroareolar region of the LEFT breast. There is slightly more breast parenchyma in the retroareolar region of the LEFT breast than the RIGHT, which is scanned for comparison.   IMPRESSION: Benign gynecomastia.    ASSESSMENT/PLAN/RECOMMENDATIONS:   Left Gynecomastia :  -This has been idiopathic in nature -We discussed high risk of gynecomastia in conjunction with cannabis use -Historically he has had normal FSH, LH, estradiol, testosterone, TFTs and prolactin  Medication Continue tamoxifen 20 mg daily     F/U in 6 months    Signed electronically by: Lyndle Herrlich, MD  [facility]  [facility] [address] [place] [place], [place] [address] Phone: [phone] FAX: [phone]   CC: Marcine Matar, MD [address] [place] [address] Phone: [phone] Fax: [phone]   Return to Endocrinology clinic as below: Future Appointments  Date Time Provider Department Center  11/05/2023  7:30 AM Caniya Tagle, Konrad Dolores, MD [id number] None

## 2024-11-23 ENCOUNTER — Encounter: Payer: Self-pay | Admitting: Internal Medicine
